# Patient Record
Sex: Male | Born: 1985 | Hispanic: No | Marital: Single | Smoking: Never smoker
Health system: Southern US, Community
[De-identification: ages and names within clinical notes are randomized; demographics above are authoritative.]

## PROBLEM LIST (undated history)

## (undated) DIAGNOSIS — M5126 Other intervertebral disc displacement, lumbar region: Secondary | ICD-10-CM

## (undated) HISTORY — PX: ANTERIOR CRUCIATE LIGAMENT REPAIR: SHX115

---
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
## 2008-06-26 ENCOUNTER — Emergency Department (HOSPITAL_COMMUNITY): Admission: EM | Admit: 2008-06-26 | Discharge: 2008-06-26 | Payer: Self-pay | Admitting: Emergency Medicine

## 2008-07-02 ENCOUNTER — Encounter: Admission: RE | Admit: 2008-07-02 | Discharge: 2008-07-02 | Payer: Self-pay | Admitting: Orthopedic Surgery

## 2014-09-21 ENCOUNTER — Emergency Department (HOSPITAL_COMMUNITY): Payer: BC Managed Care – PPO

## 2014-09-21 ENCOUNTER — Emergency Department (HOSPITAL_COMMUNITY)
Admission: EM | Admit: 2014-09-21 | Discharge: 2014-09-21 | Disposition: A | Discharge status: Home / Self Care | Payer: BC Managed Care – PPO | Attending: Emergency Medicine | Admitting: Emergency Medicine

## 2014-09-21 ENCOUNTER — Encounter (HOSPITAL_COMMUNITY): Payer: Self-pay

## 2014-09-21 DIAGNOSIS — R52 Pain, unspecified: Secondary | ICD-10-CM

## 2014-09-21 DIAGNOSIS — Y9389 Activity, other specified: Secondary | ICD-10-CM | POA: Insufficient documentation

## 2014-09-21 DIAGNOSIS — X58XXXA Exposure to other specified factors, initial encounter: Secondary | ICD-10-CM | POA: Insufficient documentation

## 2014-09-21 DIAGNOSIS — Y998 Other external cause status: Secondary | ICD-10-CM | POA: Diagnosis not present

## 2014-09-21 DIAGNOSIS — S39012A Strain of muscle, fascia and tendon of lower back, initial encounter: Secondary | ICD-10-CM | POA: Diagnosis not present

## 2014-09-21 DIAGNOSIS — S3992XA Unspecified injury of lower back, initial encounter: Secondary | ICD-10-CM | POA: Diagnosis present

## 2014-09-21 DIAGNOSIS — Y9289 Other specified places as the place of occurrence of the external cause: Secondary | ICD-10-CM | POA: Diagnosis not present

## 2014-09-21 HISTORY — DX: Other intervertebral disc displacement, lumbar region: M51.26

## 2014-09-21 MED ORDER — HYDROMORPHONE HCL 1 MG/ML IJ SOLN
1.0000 mg | Freq: Once | INTRAMUSCULAR | Status: AC
  Administered 2014-09-21: 1 mg via INTRAMUSCULAR
  Filled 2014-09-21: qty 1

## 2014-09-21 MED ORDER — NAPROXEN 500 MG PO TABS: 500.0000 mg | ORAL_TABLET | Freq: Two times a day (BID) | ORAL | Status: DC

## 2014-09-21 MED ORDER — HYDROCODONE-ACETAMINOPHEN 5-325 MG PO TABS: ORAL_TABLET | ORAL | Status: DC

## 2014-09-21 NOTE — ED Provider Notes (Signed)
CSN: [id number]     Arrival date & time 09/21/14  1613 History   First MD Initiated Contact with Patient 09/21/14 1655     Chief Complaint  Patient presents with  . Back Pain     (Consider location/radiation/quality/duration/timing/severity/associated sxs/prior Treatment) Patient is a 28 y.o. male presenting with back pain. The history is provided by the patient (the pt complains of back pain with pain in both legs).  Back Pain Location:  Generalized Quality:  Aching Radiates to: radiates down back of both legs. Pain severity:  Moderate Pain is:  Unable to specify Onset quality:  Gradual Timing:  Constant Progression:  Worsening Associated symptoms: no abdominal pain, no chest pain and no headaches     Past Medical History  Diagnosis Date  . Lumbar herniated disc    Past Surgical History  Procedure Laterality Date  . Anterior cruciate ligament repair Left    History reviewed. No pertinent family history. History  Substance Use Topics  . Smoking status: Never Smoker   . Smokeless tobacco: Not on file  . Alcohol Use: No    Review of Systems  Constitutional: Negative for appetite change and fatigue.  HENT: Negative for congestion, ear discharge and sinus pressure.   Eyes: Negative for discharge.  Respiratory: Negative for cough.   Cardiovascular: Negative for chest pain.  Gastrointestinal: Negative for abdominal pain and diarrhea.  Genitourinary: Negative for frequency and hematuria.  Musculoskeletal: Positive for back pain.  Skin: Negative for rash.  Neurological: Negative for seizures and headaches.  Psychiatric/Behavioral: Negative for hallucinations.      Allergies  Iodine and Hepatitis b vaccine  Home Medications   Prior to Admission medications   Medication Sig Start Date End Date Taking? Authorizing Provider  HYDROcodone-acetaminophen (NORCO/VICODIN) 5-325 MG per tablet Take one every 8 hours for pain 09/21/14   Benny LennertJoseph L Jahon Bart, MD  ibuprofen  (ADVIL,MOTRIN) 200 MG tablet Take 800 mg by mouth every 6 (six) hours as needed for moderate pain (back pain).   Yes Historical Provider, MD  naproxen (NAPROSYN) 500 MG tablet Take 1 tablet (500 mg total) by mouth 2 (two) times daily. 09/21/14   Benny LennertJoseph L Wahid Holley, MD   BP 150/79 mmHg  Pulse 72  Temp(Src) 98.7 F (37.1 C) (Oral)  Resp 16  SpO2 99% Physical Exam  Constitutional: He is oriented to person, place, and time. He appears well-developed.  HENT:  Head: Normocephalic.  Eyes: Conjunctivae and EOM are normal. No scleral icterus.  Neck: Neck supple. No thyromegaly present.  Cardiovascular: Normal rate and regular rhythm.  Exam reveals no gallop and no friction rub.   No murmur heard. Pulmonary/Chest: No stridor. He has no wheezes. He has no rales. He exhibits no tenderness.  Abdominal: He exhibits no distension. There is no tenderness. There is no rebound.  Musculoskeletal: Normal range of motion. He exhibits no edema.  Tender lumbar spine with pos straight leg raise bilaterally  Lymphadenopathy:    He has no cervical adenopathy.  Neurological: He is oriented to person, place, and time. He has normal reflexes. He exhibits normal muscle tone. Coordination normal.  Skin: No rash noted. No erythema.  Psychiatric: He has a normal mood and affect. His behavior is normal.    ED Course  Procedures (including critical care time) Labs Review Labs Reviewed - No data to display  Imaging Review Dg Lumbar Spine Complete  09/21/2014   CLINICAL DATA:  Pain after twisting injury days ago lung getting in the are.  EXAM:  LUMBAR SPINE - COMPLETE 4+ VIEW  COMPARISON:  MRI lumbar spine 07/02/2008  FINDINGS: There is no evidence of lumbar spine fracture. Alignment is normal. Intervertebral disc spaces are maintained.  IMPRESSION: Negative.   Electronically Signed   By: Elige KoHetal  Patel   On: 09/21/2014 17:44     EKG Interpretation None      MDM   Final diagnoses:  Pain  Lumbar strain, initial  encounter    tx with naprosyn,  vicodin and follow up    Benny LennertJoseph L Bettina Warn, MD 09/21/14 [phone]

## 2014-09-21 NOTE — ED Notes (Signed)
Pt c/o chronic mid and lower back that has increased over the past 8 days "after getting into the car."  Pain score 8/10.  Sts Hx of bulging and herniated discs in lumbar region.  Sts in the past it has been treated w/ pain medication and physical therapy.  Pt does not have a PCP or neurologist.  Sts "severe burning and numbness in both legs."

## 2014-09-21 NOTE — Discharge Instructions (Signed)
Follow up with your ortho md next week.  No lifting over 10 pounds

## 2014-09-21 NOTE — ED Notes (Signed)
Patient transported to X-ray 

## 2016-04-09 IMAGING — CR DG LUMBAR SPINE COMPLETE 4+V
5 series · 5 of 5 positions shown · non-contrast
Comparison: MRI lumbar spine 07/02/2008

CLINICAL DATA: Pain after twisting injury days ago lung getting in
the are.

EXAM:
LUMBAR SPINE - COMPLETE 4+ VIEW

[t lumbar spine ap]
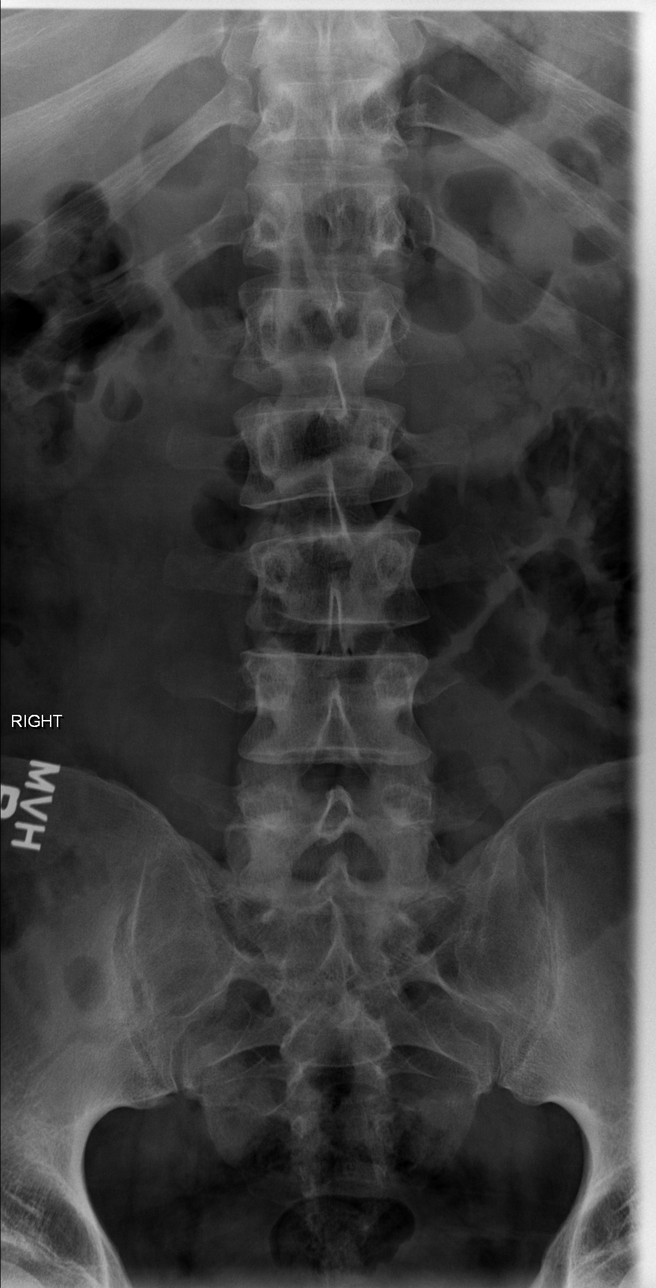

[t lumbar spine obl (1 of 2)]
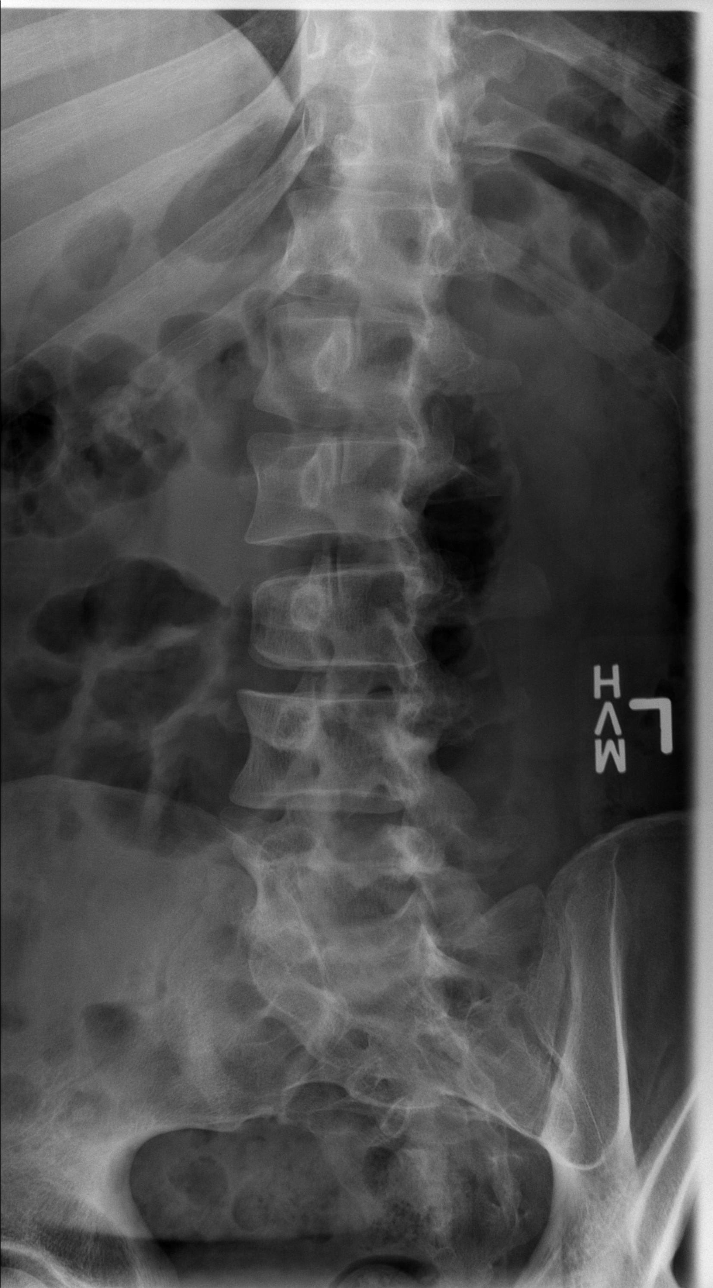

[t lumbar spine obl (2 of 2)]
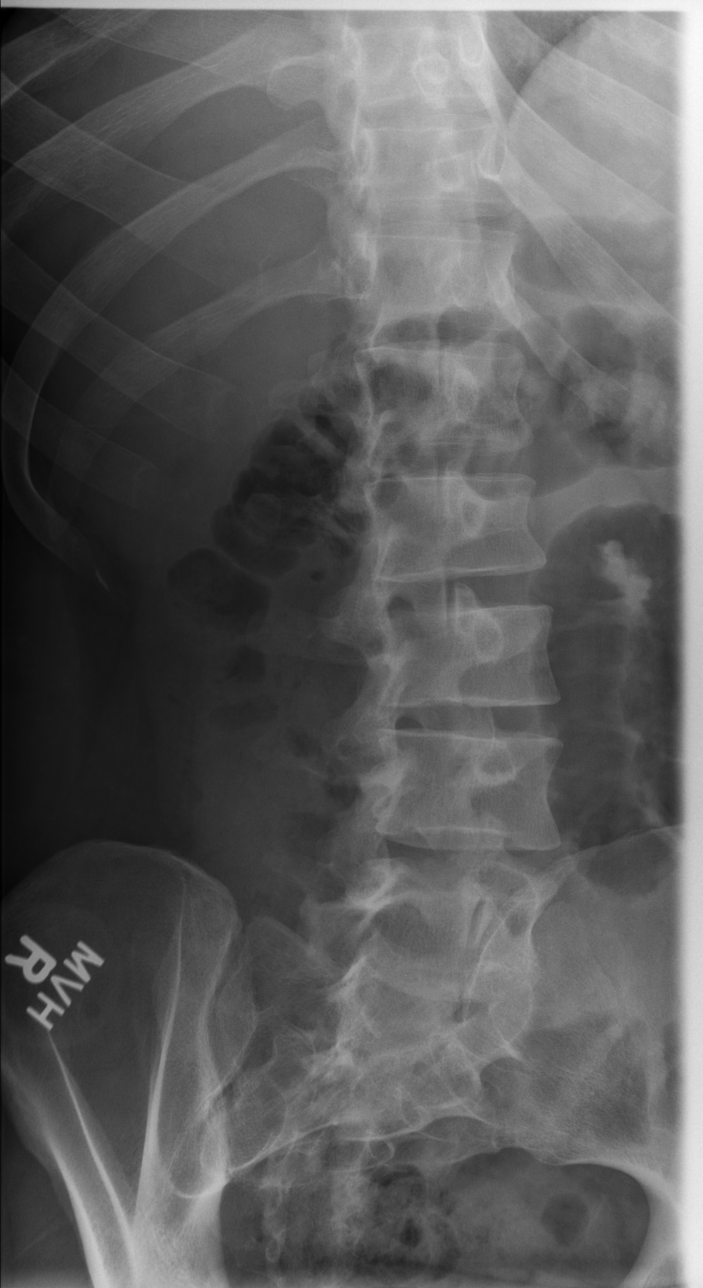

[t lumbar spine lat]
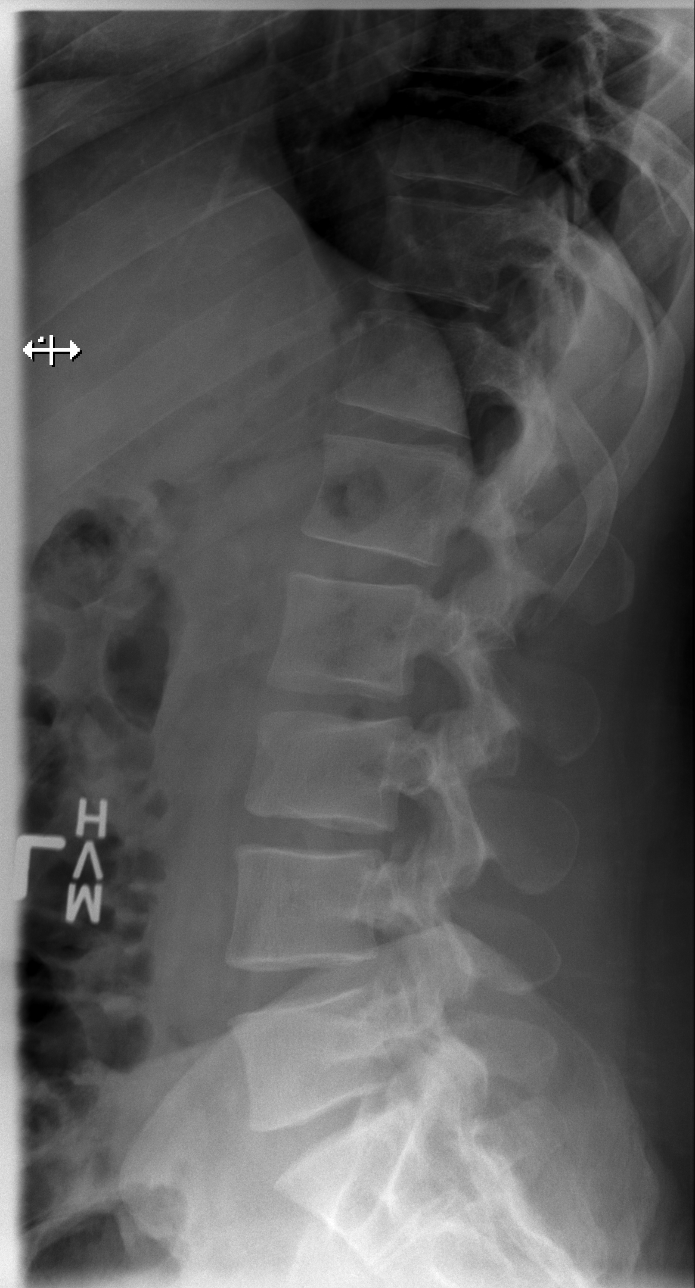

[t lumbar l-5 s-1 spot]
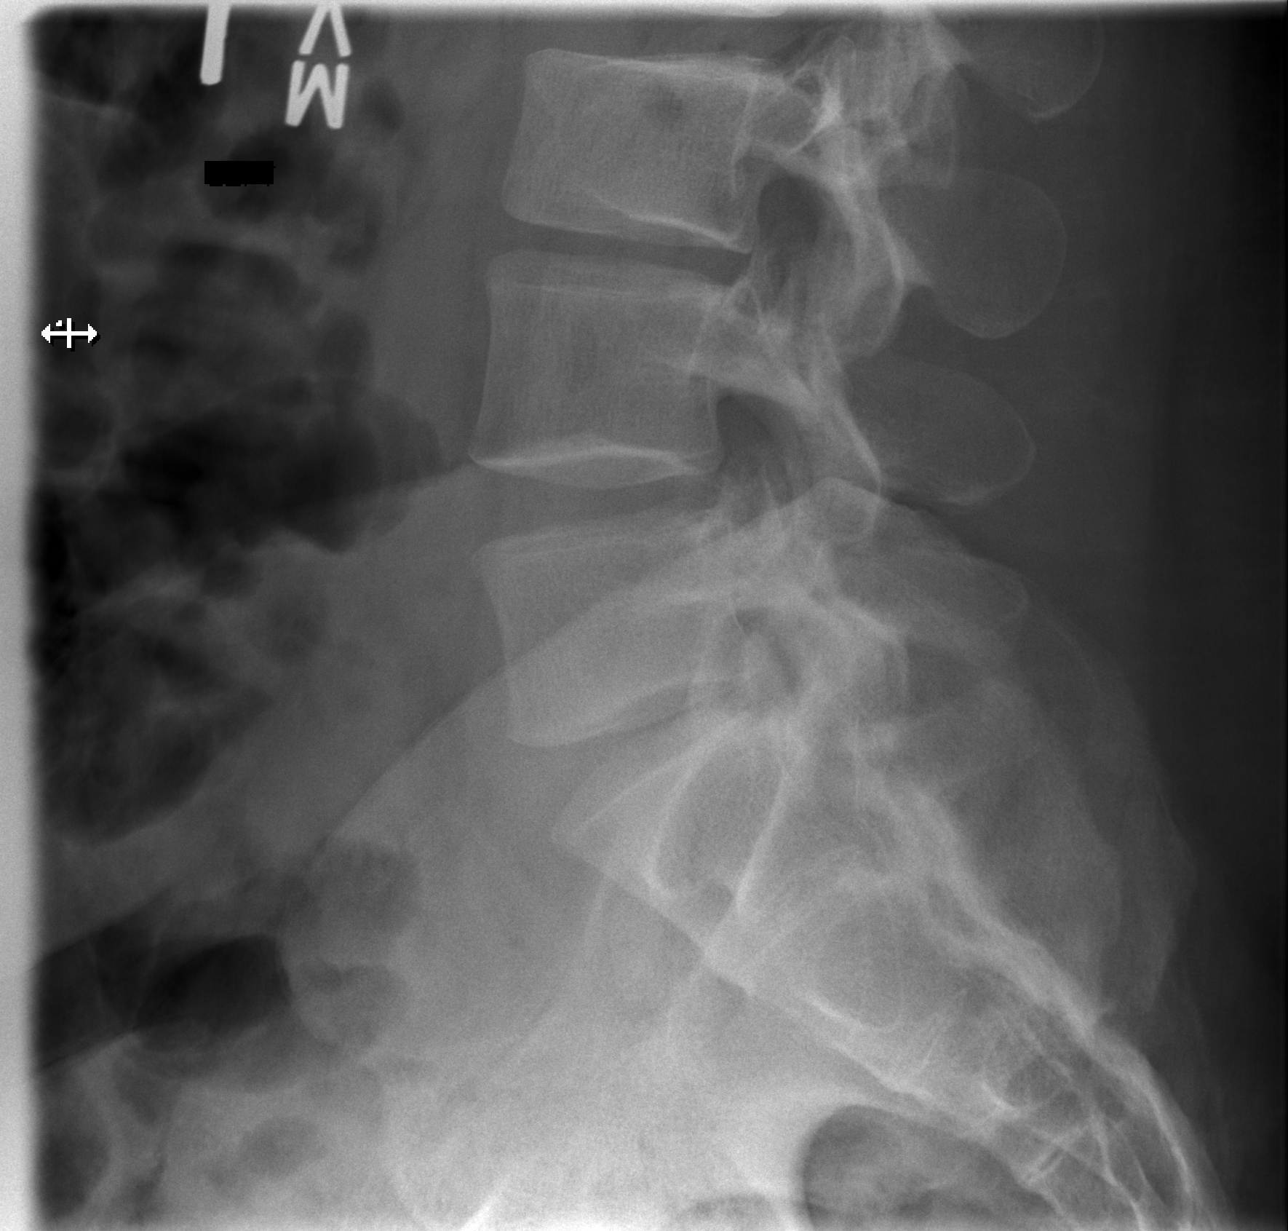

[5 of 5 positions shown; findings below may reference images not displayed]

FINDINGS: There is no evidence of lumbar spine fracture. Alignment is normal.
Intervertebral disc spaces are maintained.
IMPRESSION: Negative.

## 2022-12-31 DIAGNOSIS — B009 Herpesviral infection, unspecified: Secondary | ICD-10-CM | POA: Diagnosis not present

## 2023-01-10 ENCOUNTER — Telehealth: Payer: Self-pay | Admitting: Internal Medicine

## 2023-01-10 NOTE — Telephone Encounter (Signed)
Pt's New Patient Packet was returned to [place] as undeliverable. Called Pt and left messages on both lines for Pt to call us back to confirm address on file.

## 2023-01-14 NOTE — Telephone Encounter (Signed)
Noted  

## 2023-04-30 ENCOUNTER — Ambulatory Visit: Payer: BC Managed Care – PPO | Admitting: Internal Medicine

## 2023-04-30 ENCOUNTER — Encounter: Payer: Self-pay | Admitting: Internal Medicine

## 2023-04-30 VITALS — BP 137/92 | HR 70 | Temp 98.2°F | Ht 72.0 in | Wt 269.3 lb

## 2023-04-30 DIAGNOSIS — Z1159 Encounter for screening for other viral diseases: Secondary | ICD-10-CM | POA: Diagnosis not present

## 2023-04-30 DIAGNOSIS — Z114 Encounter for screening for human immunodeficiency virus [HIV]: Secondary | ICD-10-CM

## 2023-04-30 DIAGNOSIS — Z23 Encounter for immunization: Secondary | ICD-10-CM | POA: Diagnosis not present

## 2023-04-30 DIAGNOSIS — R030 Elevated blood-pressure reading, without diagnosis of hypertension: Secondary | ICD-10-CM

## 2023-04-30 DIAGNOSIS — Z0000 Encounter for general adult medical examination without abnormal findings: Secondary | ICD-10-CM

## 2023-04-30 LAB — LIPID PANEL
Cholesterol: 179 mg/dL (ref 0–200)
HDL: 57.7 mg/dL (ref >39.00)
LDL Cholesterol: 105 mg/dL — ABNORMAL HIGH (ref 0–99)
NonHDL: 121.05
Total CHOL/HDL Ratio: 3
Triglycerides: 78 mg/dL (ref 0.0–149.0)
VLDL: 15.6 mg/dL (ref 0.0–40.0)

## 2023-04-30 LAB — CBC WITH DIFFERENTIAL/PLATELET
Basophils Absolute: 0.1 10*3/uL (ref 0.0–0.1)
Basophils Relative: 0.9 % (ref 0.0–3.0)
Eosinophils Absolute: 0.3 10*3/uL (ref 0.0–0.7)
Eosinophils Relative: 3.2 % (ref 0.0–5.0)
HCT: 44.2 % (ref 39.0–52.0)
Hemoglobin: 14.3 g/dL (ref 13.0–17.0)
Lymphocytes Relative: 38 % (ref 12.0–46.0)
Lymphs Abs: 3 10*3/uL (ref 0.7–4.0)
MCHC: 32.5 g/dL (ref 30.0–36.0)
MCV: 92.4 fl (ref 78.0–100.0)
Monocytes Absolute: 0.7 10*3/uL (ref 0.1–1.0)
Monocytes Relative: 9.2 % (ref 3.0–12.0)
Neutro Abs: 3.9 10*3/uL (ref 1.4–7.7)
Neutrophils Relative %: 48.7 % (ref 43.0–77.0)
Platelets: 295 10*3/uL (ref 150.0–400.0)
RBC: 4.78 Mil/uL (ref 4.22–5.81)
RDW: 13.4 % (ref 11.5–15.5)
WBC: 7.9 10*3/uL (ref 4.0–10.5)

## 2023-04-30 LAB — COMPREHENSIVE METABOLIC PANEL
ALT: 28 U/L (ref 0–53)
AST: 22 U/L (ref 0–37)
Albumin: 4.6 g/dL (ref 3.5–5.2)
Alkaline Phosphatase: 51 U/L (ref 39–117)
BUN: 14 mg/dL (ref 6–23)
CO2: 27 mEq/L (ref 19–32)
Calcium: 10 mg/dL (ref 8.4–10.5)
Chloride: 102 mEq/L (ref 96–112)
Creatinine, Ser: 0.99 mg/dL (ref 0.40–1.50)
GFR: 96.18 mL/min (ref >60.00)
Glucose, Bld: 103 mg/dL — ABNORMAL HIGH (ref 70–99)
Potassium: 4.2 mEq/L (ref 3.5–5.1)
Sodium: 135 mEq/L (ref 135–145)
Total Bilirubin: 0.4 mg/dL (ref 0.2–1.2)
Total Protein: 7.7 g/dL (ref 6.0–8.3)

## 2023-04-30 LAB — HEMOGLOBIN A1C: Hgb A1c MFr Bld: 5.5 % (ref 4.6–6.5)

## 2023-04-30 NOTE — Addendum Note (Signed)
Addended by: Kern Reap B on: 04/30/2023 11:25 AM   Modules accepted: Orders

## 2023-04-30 NOTE — Progress Notes (Signed)
New Patient Office Visit     CC/Reason for Visit: Establish care, annual preventive exam Previous PCP: None Last Visit: Unknown  HPI: Philip Soto is a 37 y.o. male who is coming in today for the above mentioned reasons.  No past medical history of significance.  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  He has routine eye care but is overdue for dental care.  He does not smoke or drink, no drug allergies, his past surgical history is significant for a left ACL repair.  Family history significant for father with hypertension, hyperlipidemia, diabetes, maternal grandfather with coronary artery disease.  He has noted to have elevated blood pressure today on 2 separate in office measurements.  He has lost 40 pounds in 7 months with healthy lifestyle changes.  He exercises 4 days a week.   Past Medical/Surgical History: Past Medical History:  Diagnosis Date   Lumbar herniated disc     Past Surgical History:  Procedure Laterality Date   ANTERIOR CRUCIATE LIGAMENT REPAIR Left     Social History:  reports that he has never smoked. He does not have any smokeless tobacco history on file. He reports that he does not drink alcohol and does not use drugs.  Allergies: Allergies  Allergen Reactions   Iodine Anaphylaxis   Hepatitis B Vaccine Hives, Nausea And Vomiting and Rash    Family History:  Family History  Problem Relation Age of Onset   Hypertension Father    Hyperlipidemia Father    Diabetes Father      Current Outpatient Medications:    valACYclovir (VALTREX) 500 MG tablet, Take 500 mg by mouth 2 (two) times daily as needed., Disp: , Rfl:   Review of Systems:  Negative except as indicated in HPI.   Physical Exam: Vitals:   04/30/23 0909 04/30/23 0915  BP: (!) 130/90 (!) 137/92  Pulse: 70   Temp: 98.2 F (36.8 C)   TempSrc: Oral   SpO2: 99%   Weight: 269 lb 4.8 oz (122.2 kg)   Height: 6' (1.829 m)    Body mass index is 36.52 kg/m.  Physical  Exam Vitals reviewed.  Constitutional:      General: He is not in acute distress.    Appearance: Normal appearance. He is not ill-appearing, toxic-appearing or diaphoretic.  HENT:     Head: Normocephalic.     Right Ear: Tympanic membrane, ear canal and external ear normal. There is no impacted cerumen.     Left Ear: Tympanic membrane, ear canal and external ear normal. There is no impacted cerumen.     Nose: Nose normal.     Mouth/Throat:     Mouth: Mucous membranes are moist.     Pharynx: Oropharynx is clear. No oropharyngeal exudate or posterior oropharyngeal erythema.  Eyes:     General: No scleral icterus.       Right eye: No discharge.        Left eye: No discharge.     Conjunctiva/sclera: Conjunctivae normal.     Pupils: Pupils are equal, round, and reactive to light.  Neck:     Vascular: No carotid bruit.  Cardiovascular:     Rate and Rhythm: Normal rate and regular rhythm.     Pulses: Normal pulses.     Heart sounds: Normal heart sounds.  Pulmonary:     Effort: Pulmonary effort is normal. No respiratory distress.     Breath sounds: Normal breath sounds.  Abdominal:     General: Abdomen is  flat. Bowel sounds are normal.     Palpations: Abdomen is soft.  Musculoskeletal:        General: Normal range of motion.     Cervical back: Normal range of motion.  Skin:    General: Skin is warm and dry.  Neurological:     General: No focal deficit present.     Mental Status: He is alert and oriented to person, place, and time. Mental status is at baseline.  Psychiatric:        Mood and Affect: Mood normal.        Behavior: Behavior normal.        Thought Content: Thought content normal.        Judgment: Judgment normal.       Impression and Plan:  Encounter for preventive health examination -     CBC with Differential/Platelet; Future -     Comprehensive metabolic panel; Future -     Hemoglobin A1c; Future -     Lipid panel; Future  Immunization due  Encounter for  hepatitis C screening test for low risk patient -     Hepatitis C antibody; Future  Encounter for screening for HIV -     HIV Antibody (routine testing w rflx); Future  Elevated BP without diagnosis of hypertension   -Recommend routine eye and dental care. -Healthy lifestyle discussed in detail. -Labs to be updated today. -Prostate cancer screening: N/A due to age Health Maintenance  Topic Date Due   HIV Screening  Never done   Hepatitis C Screening  Never done   DTaP/Tdap/Td vaccine (1 - Tdap) Never done   COVID-19 Vaccine (1 - 2023-24 season) Never done   Flu Shot  05/16/2023   HPV Vaccine  Aged Out      -Tdap in office today.    Chaya Jan, MD [facility]

## 2023-05-01 LAB — HIV ANTIBODY (ROUTINE TESTING W REFLEX): HIV 1&2 Ab, 4th Generation: NONREACTIVE

## 2023-05-01 LAB — HEPATITIS C ANTIBODY: Hepatitis C Ab: NONREACTIVE

## 2024-04-09 ENCOUNTER — Other Ambulatory Visit: Payer: Self-pay | Admitting: Internal Medicine

## 2024-04-09 ENCOUNTER — Encounter

## 2024-04-09 MED ORDER — VALACYCLOVIR HCL 500 MG PO TABS: 500.0000 mg | ORAL_TABLET | Freq: Two times a day (BID) | ORAL | 0 refills | Status: AC | PRN

## 2024-04-09 NOTE — Telephone Encounter (Signed)
 Copied from CRM [id number]. Topic: Clinical - Medication Refill >> Apr 09, 2024  8:34 AM Tanazia G wrote: Medication:  valACYclovir (VALTREX) 500 MG tablet  Has the patient contacted their pharmacy? Yes (Agent: If no, request that the patient contact the pharmacy for the refill. If patient does not wish to contact the pharmacy document the reason why and proceed with request.) (Agent: If yes, when and what did the pharmacy advise?)  This is the patient's preferred pharmacy:  [id number] - [place], [place] - [address] AT [address] [place] [address] Phone: [phone] Fax: [phone]  Is this the correct pharmacy for this prescription? Yes If no, delete pharmacy and type the correct one.   Has the prescription been filled recently? Yes  Is the patient out of the medication? Yes  Has the patient been seen for an appointment in the last year OR does the patient have an upcoming appointment? Yes  Can we respond through MyChart? Yes  Agent: Please be advised that Rx refills may take up to 3 business days. We ask that you follow-up with your pharmacy.

## 2024-05-12 ENCOUNTER — Encounter: Payer: Self-pay | Admitting: Internal Medicine

## 2024-05-12 ENCOUNTER — Ambulatory Visit (INDEPENDENT_AMBULATORY_CARE_PROVIDER_SITE_OTHER): Admitting: Internal Medicine

## 2024-05-12 VITALS — BP 120/88 | HR 64 | Temp 97.7°F | Ht 72.0 in | Wt 264.5 lb

## 2024-05-12 DIAGNOSIS — R030 Elevated blood-pressure reading, without diagnosis of hypertension: Secondary | ICD-10-CM

## 2024-05-12 DIAGNOSIS — Z6835 Body mass index (BMI) 35.0-35.9, adult: Secondary | ICD-10-CM

## 2024-05-12 DIAGNOSIS — Z0000 Encounter for general adult medical examination without abnormal findings: Secondary | ICD-10-CM

## 2024-05-12 DIAGNOSIS — E6609 Other obesity due to excess calories: Secondary | ICD-10-CM | POA: Diagnosis not present

## 2024-05-12 DIAGNOSIS — E66812 Obesity, class 2: Secondary | ICD-10-CM | POA: Diagnosis not present

## 2024-05-12 LAB — TSH: TSH: 1.53 u[IU]/mL (ref 0.35–5.50)

## 2024-05-12 LAB — CBC WITH DIFFERENTIAL/PLATELET
Basophils Absolute: 0.1 K/uL (ref 0.0–0.1)
Basophils Relative: 0.9 % (ref 0.0–3.0)
Eosinophils Absolute: 0.3 K/uL (ref 0.0–0.7)
Eosinophils Relative: 3.7 % (ref 0.0–5.0)
HCT: 43.6 % (ref 39.0–52.0)
Hemoglobin: 14.5 g/dL (ref 13.0–17.0)
Lymphocytes Relative: 40.7 % (ref 12.0–46.0)
Lymphs Abs: 2.8 K/uL (ref 0.7–4.0)
MCHC: 33.3 g/dL (ref 30.0–36.0)
MCV: 91.5 fl (ref 78.0–100.0)
Monocytes Absolute: 0.6 K/uL (ref 0.1–1.0)
Monocytes Relative: 8.7 % (ref 3.0–12.0)
Neutro Abs: 3.2 K/uL (ref 1.4–7.7)
Neutrophils Relative %: 46 % (ref 43.0–77.0)
Platelets: 274 K/uL (ref 150.0–400.0)
RBC: 4.77 Mil/uL (ref 4.22–5.81)
RDW: 13.2 % (ref 11.5–15.5)
WBC: 6.9 K/uL (ref 4.0–10.5)

## 2024-05-12 LAB — COMPREHENSIVE METABOLIC PANEL WITH GFR
ALT: 21 U/L (ref 0–53)
AST: 18 U/L (ref 0–37)
Albumin: 4.8 g/dL (ref 3.5–5.2)
Alkaline Phosphatase: 41 U/L (ref 39–117)
BUN: 12 mg/dL (ref 6–23)
CO2: 26 meq/L (ref 19–32)
Calcium: 9.4 mg/dL (ref 8.4–10.5)
Chloride: 99 meq/L (ref 96–112)
Creatinine, Ser: 0.87 mg/dL (ref 0.40–1.50)
GFR: 108.15 mL/min (ref >60.00)
Glucose, Bld: 76 mg/dL (ref 70–99)
Potassium: 4 meq/L (ref 3.5–5.1)
Sodium: 136 meq/L (ref 135–145)
Total Bilirubin: 0.6 mg/dL (ref 0.2–1.2)
Total Protein: 7.4 g/dL (ref 6.0–8.3)

## 2024-05-12 LAB — LIPID PANEL
Cholesterol: 184 mg/dL (ref 0–200)
HDL: 60.3 mg/dL (ref >39.00)
LDL Cholesterol: 112 mg/dL — ABNORMAL HIGH (ref 0–99)
NonHDL: 123.62
Total CHOL/HDL Ratio: 3
Triglycerides: 58 mg/dL (ref 0.0–149.0)
VLDL: 11.6 mg/dL (ref 0.0–40.0)

## 2024-05-12 LAB — VITAMIN D 25 HYDROXY (VIT D DEFICIENCY, FRACTURES): VITD: 27.57 ng/mL — ABNORMAL LOW (ref 30.00–100.00)

## 2024-05-12 LAB — PSA: PSA: 0.32 ng/mL (ref 0.10–4.00)

## 2024-05-12 LAB — VITAMIN B12: Vitamin B-12: 510 pg/mL (ref 211–911)

## 2024-05-12 NOTE — Progress Notes (Signed)
 Established Patient Office Visit     CC/Reason for Visit: Annual preventive Exam  HPI: Philip Soto is a 38 y.o. male who is coming in today for the above mentioned reasons.  No past medical history of significance other than mild obesity.  Has been feeling well without acute concerns or complaints.   Past Medical/Surgical History: Past Medical History:  Diagnosis Date   Lumbar herniated disc     Past Surgical History:  Procedure Laterality Date   ANTERIOR CRUCIATE LIGAMENT REPAIR Left     Social History:  reports that he has never smoked. He does not have any smokeless tobacco history on file. He reports that he does not drink alcohol and does not use drugs.  Allergies: Allergies  Allergen Reactions   Iodine Anaphylaxis   Hepatitis B Vaccine Hives, Nausea And Vomiting and Rash    Family History:  Family History  Problem Relation Age of Onset   Hypertension Father    Hyperlipidemia Father    Diabetes Father      Current Outpatient Medications:    valACYclovir  (VALTREX ) 500 MG tablet, Take 1 tablet (500 mg total) by mouth 2 (two) times daily as needed., Disp: 60 tablet, Rfl: 0  Review of Systems:  Negative unless indicated in HPI.   Physical Exam: Vitals:   05/12/24 1308  BP: 120/88  Pulse: 64  Temp: 97.7 F (36.5 C)  TempSrc: Oral  SpO2: 100%  Weight: 264 lb 8 oz (120 kg)  Height: 6' (1.829 m)    Body mass index is 35.87 kg/m.   Physical Exam Vitals reviewed.  Constitutional:      General: He is not in acute distress.    Appearance: Normal appearance. He is obese. He is not ill-appearing, toxic-appearing or diaphoretic.  HENT:     Head: Normocephalic.     Right Ear: Tympanic membrane, ear canal and external ear normal. There is no impacted cerumen.     Left Ear: Tympanic membrane, ear canal and external ear normal. There is no impacted cerumen.     Nose: Nose normal.     Mouth/Throat:     Mouth: Mucous membranes are moist.      Pharynx: Oropharynx is clear. No oropharyngeal exudate or posterior oropharyngeal erythema.  Eyes:     General: No scleral icterus.       Right eye: No discharge.        Left eye: No discharge.     Conjunctiva/sclera: Conjunctivae normal.  Neck:     Vascular: No carotid bruit.  Cardiovascular:     Rate and Rhythm: Normal rate and regular rhythm.     Pulses: Normal pulses.     Heart sounds: Normal heart sounds.  Pulmonary:     Effort: Pulmonary effort is normal. No respiratory distress.     Breath sounds: Normal breath sounds.  Abdominal:     General: Abdomen is flat. Bowel sounds are normal.     Palpations: Abdomen is soft.  Musculoskeletal:        General: Normal range of motion.     Cervical back: Normal range of motion.  Skin:    General: Skin is warm and dry.  Neurological:     General: No focal deficit present.     Mental Status: He is alert and oriented to person, place, and time. Mental status is at baseline.  Psychiatric:        Mood and Affect: Mood normal.  Behavior: Behavior normal.        Thought Content: Thought content normal.        Judgment: Judgment normal.       Impression and Plan:  Encounter for preventive health examination  Elevated BP without diagnosis of hypertension  Class 2 obesity due to excess calories without serious comorbidity with body mass index (BMI) of 35.0 to 35.9 in adult -     CBC with Differential/Platelet; Future -     Comprehensive metabolic panel with GFR; Future -     Lipid panel; Future -     PSA; Future -     TSH; Future -     Vitamin B12; Future -     VITAMIN D  25 Hydroxy (Vit-D Deficiency, Fractures); Future   -Recommend routine eye and dental care. -Healthy lifestyle discussed in detail. -Labs to be updated today. -Prostate cancer screening: PSA today Health Maintenance  Topic Date Due   Hepatitis B Vaccine (1 of 3 - 19+ 3-dose series) Never done   HPV Vaccine (1 - 3-dose SCDM series) Never done    COVID-19 Vaccine (1 - 2024-25 season) Never done   Flu Shot  05/15/2024   DTaP/Tdap/Td vaccine (2 - Td or Tdap) 04/29/2033   Hepatitis C Screening  Completed   HIV Screening  Completed   Meningitis B Vaccine  Aged Out        Timmie Dugue Theophilus Andrews, MD [facility]

## 2024-05-14 ENCOUNTER — Ambulatory Visit: Payer: Self-pay | Admitting: Internal Medicine

## 2024-05-14 DIAGNOSIS — E559 Vitamin D deficiency, unspecified: Secondary | ICD-10-CM

## 2024-05-14 MED ORDER — VITAMIN D (ERGOCALCIFEROL) 1.25 MG (50000 UNIT) PO CAPS: 50000.0000 [IU] | ORAL_CAPSULE | ORAL | 0 refills | Status: AC
